# Patient Record
Sex: Female | Born: 1960 | Smoking: Former smoker
Health system: Southern US, Community
[De-identification: ages and names within clinical notes are randomized; demographics above are authoritative.]

## PROBLEM LIST (undated history)

## (undated) DIAGNOSIS — I10 Essential (primary) hypertension: Secondary | ICD-10-CM

## (undated) DIAGNOSIS — R51 Headache: Secondary | ICD-10-CM

## (undated) DIAGNOSIS — K219 Gastro-esophageal reflux disease without esophagitis: Secondary | ICD-10-CM

## (undated) DIAGNOSIS — T7840XA Allergy, unspecified, initial encounter: Secondary | ICD-10-CM

## (undated) DIAGNOSIS — Z87442 Personal history of urinary calculi: Secondary | ICD-10-CM

## (undated) HISTORY — PX: LITHOTRIPSY: SUR834

## (undated) HISTORY — PX: TEMPOROMANDIBULAR JOINT SURGERY: SHX35

## (undated) HISTORY — DX: Allergy, unspecified, initial encounter: T78.40XA

## (undated) HISTORY — PX: UPPER GASTROINTESTINAL ENDOSCOPY: SHX188

## (undated) HISTORY — PX: TUBAL LIGATION: SHX77

---

## 1999-09-26 ENCOUNTER — Other Ambulatory Visit: Admission: RE | Admit: 1999-09-26 | Discharge: 1999-09-26 | Payer: Self-pay | Admitting: Gynecology

## 2002-01-22 ENCOUNTER — Other Ambulatory Visit: Admission: RE | Admit: 2002-01-22 | Discharge: 2002-01-22 | Payer: Self-pay | Admitting: Gynecology

## 2003-01-27 ENCOUNTER — Other Ambulatory Visit: Admission: RE | Admit: 2003-01-27 | Discharge: 2003-01-27 | Payer: Self-pay | Admitting: Gynecology

## 2004-09-21 ENCOUNTER — Other Ambulatory Visit: Admission: RE | Admit: 2004-09-21 | Discharge: 2004-09-21 | Payer: Self-pay | Admitting: Gynecology

## 2011-05-07 DIAGNOSIS — Z87442 Personal history of urinary calculi: Secondary | ICD-10-CM

## 2011-05-07 HISTORY — DX: Personal history of urinary calculi: Z87.442

## 2013-03-05 ENCOUNTER — Encounter: Payer: Self-pay | Admitting: Gastroenterology

## 2013-08-05 HISTORY — PX: OTHER SURGICAL HISTORY: SHX169

## 2013-09-19 ENCOUNTER — Ambulatory Visit: Payer: Self-pay | Admitting: Orthopedic Surgery

## 2013-09-30 ENCOUNTER — Other Ambulatory Visit (HOSPITAL_COMMUNITY): Payer: Self-pay | Admitting: Anesthesiology

## 2013-09-30 NOTE — Patient Instructions (Addendum)
[place]  09/30/2013   Your procedure is scheduled on: [place]  Report to [facility] Main Entrance and follow signs to  [place] at 830 AM.  Call this number if you have problems the morning of surgery [phone]   Remember:  Do not eat food or drink liquids :After Midnight.     Take these medicines the morning of surgery with A SIP OF WATER: no meds to take                               You may not have any metal on your body including hair pins and piercings  Do not wear jewelry, make-up, lotions, powders, or deodorant.   Men may shave face and neck.  Do not bring valuables to the hospital. [place].  Contacts, dentures or bridgework may not be worn into surgery.  Leave suitcase in the car. After surgery it may be brought to your room.  For patients admitted to the hospital, checkout time is 11:00 AM the day of discharge.   ________________________________________________________________________  [facility] - Preparing for Surgery Before surgery, you can play an important role.  Because skin is not sterile, your skin needs to be as free of germs as possible.  You can reduce the number of germs on your skin by washing with CHG (chlorahexidine gluconate) soap before surgery.  CHG is an antiseptic cleaner which kills germs and bonds with the skin to continue killing germs even after washing. Please DO NOT use if you have an allergy to CHG or antibacterial soaps.  If your skin becomes reddened/irritated stop using the CHG and inform your nurse when you arrive at Short Stay. Do not shave (including legs and underarms) for at least 48 hours prior to the first CHG shower.  You may shave your face/neck. Please follow these instructions carefully:  1.  Shower with CHG Soap the night before surgery and the  morning of Surgery.  2.  If you choose to wash your hair, wash your hair first as usual with your  normal   shampoo.  3.  After you shampoo, rinse your hair and body thoroughly to remove the  shampoo.                           4.  Use CHG as you would any other liquid soap.  You can apply chg directly  to the skin and wash                       Gently with a scrungie or clean washcloth.  5.  Apply the CHG Soap to your body ONLY FROM THE NECK DOWN.   Do not use on face/ open                           Wound or open sores. Avoid contact with eyes, ears mouth and genitals (private parts).                       Wash face,  Genitals (private parts) with your normal soap.             6.  Wash thoroughly, paying special attention to the area where your surgery  will be performed.  7.  Thoroughly rinse your body with warm water from the neck down.  8.  DO NOT shower/wash with your normal soap after using and rinsing off  the CHG Soap.                9.  Pat yourself dry with a clean towel.            10.  Wear clean pajamas.            11.  Place clean sheets on your bed the night of your first shower and do not  sleep with pets. Day of Surgery : Do not apply any lotions/deodorants the morning of surgery.  Please wear clean clothes to the hospital/surgery center.  FAILURE TO FOLLOW THESE INSTRUCTIONS MAY RESULT IN THE CANCELLATION OF YOUR SURGERY PATIENT SIGNATURE_________________________________  NURSE SIGNATURE__________________________________  ________________________________________________________________________   Adam Phenix  An incentive spirometer is a tool that can help keep your lungs clear and active. This tool measures how well you are filling your lungs with each breath. Taking long deep breaths may help reverse or decrease the chance of developing breathing (pulmonary) problems (especially infection) following:  A long period of time when you are unable to move or be active. BEFORE THE PROCEDURE   If the spirometer includes an indicator to show your best effort, your nurse or  respiratory therapist will set it to a desired goal.  If possible, sit up straight or lean slightly forward. Try not to slouch.  Hold the incentive spirometer in an upright position. INSTRUCTIONS FOR USE  1. Sit on the edge of your bed if possible, or sit up as far as you can in bed or on a chair. 2. Hold the incentive spirometer in an upright position. 3. Breathe out normally. 4. Place the mouthpiece in your mouth and seal your lips tightly around it. 5. Breathe in slowly and as deeply as possible, raising the piston or the ball toward the top of the column. 6. Hold your breath for 3-5 seconds or for as long as possible. Allow the piston or ball to fall to the bottom of the column. 7. Remove the mouthpiece from your mouth and breathe out normally. 8. Rest for a few seconds and repeat Steps 1 through 7 at least 10 times every 1-2 hours when you are awake. Take your time and take a few normal breaths between deep breaths. 9. The spirometer may include an indicator to show your best effort. Use the indicator as a goal to work toward during each repetition. 10. After each set of 10 deep breaths, practice coughing to be sure your lungs are clear. If you have an incision (the cut made at the time of surgery), support your incision when coughing by placing a pillow or rolled up towels firmly against it. Once you are able to get out of bed, walk around indoors and cough well. You may stop using the incentive spirometer when instructed by your caregiver.  RISKS AND COMPLICATIONS  Take your time so you do not get dizzy or light-headed.  If you are in pain, you may need to take or ask for pain medication before doing incentive spirometry. It is harder to take a deep breath if you are having pain. AFTER USE  Rest and breathe slowly and easily.  It can be helpful to keep track of a log of your progress. Your caregiver can provide you with a simple table to help with this. If you are using the  spirometer at home, follow these instructions: [place] IF:   You are having difficultly using the spirometer.  You have trouble using the spirometer as often as instructed.  Your pain medication is not giving enough relief while using the spirometer.  You develop fever of 100.5 F (38.1 C) or higher. SEEK IMMEDIATE MEDICAL CARE IF:   You cough up bloody sputum that had not been present before.  You develop fever of 102 F (38.9 C) or greater.  You develop worsening pain at or near the incision site. MAKE SURE YOU:   Understand these instructions.  Will watch your condition.  Will get help right away if you are not doing well or get worse. Document Released: 06/04/2006 Document Revised: 04/16/2011 Document Reviewed: 08/05/2006 [facility] Patient Information 2014 [place], [place].   ________________________________________________________________________

## 2013-10-01 ENCOUNTER — Ambulatory Visit (HOSPITAL_COMMUNITY)
Admission: RE | Admit: 2013-10-01 | Discharge: 2013-10-01 | Disposition: A | Discharge status: Home / Self Care | Payer: BC Managed Care – PPO | Source: Ambulatory Visit | Attending: Anesthesiology | Admitting: Anesthesiology

## 2013-10-01 ENCOUNTER — Encounter (HOSPITAL_COMMUNITY)
Admission: RE | Admit: 2013-10-01 | Discharge: 2013-10-01 | Disposition: A | Discharge status: Home / Self Care | Payer: BC Managed Care – PPO | Source: Ambulatory Visit | Attending: Specialist | Admitting: Specialist

## 2013-10-01 ENCOUNTER — Encounter (HOSPITAL_COMMUNITY): Payer: Self-pay | Admitting: Pharmacy Technician

## 2013-10-01 ENCOUNTER — Encounter (HOSPITAL_COMMUNITY): Payer: Self-pay

## 2013-10-01 DIAGNOSIS — Z01818 Encounter for other preprocedural examination: Secondary | ICD-10-CM | POA: Insufficient documentation

## 2013-10-01 DIAGNOSIS — I10 Essential (primary) hypertension: Secondary | ICD-10-CM | POA: Insufficient documentation

## 2013-10-01 HISTORY — DX: Essential (primary) hypertension: I10

## 2013-10-01 HISTORY — DX: Headache: R51

## 2013-10-01 HISTORY — DX: Gastro-esophageal reflux disease without esophagitis: K21.9

## 2013-10-01 HISTORY — DX: Personal history of urinary calculi: Z87.442

## 2013-10-01 LAB — CBC
HEMATOCRIT: 40 % (ref 36.0–46.0)
HEMOGLOBIN: 13.3 g/dL (ref 12.0–15.0)
MCH: 29.4 pg (ref 26.0–34.0)
MCHC: 33.3 g/dL (ref 30.0–36.0)
MCV: 88.3 fL (ref 78.0–100.0)
Platelets: 222 10*3/uL (ref 150–400)
RBC: 4.53 MIL/uL (ref 3.87–5.11)
RDW: 13 % (ref 11.5–15.5)
WBC: 5.7 10*3/uL (ref 4.0–10.5)

## 2013-10-01 LAB — BASIC METABOLIC PANEL
Anion gap: 9 (ref 5–15)
BUN: 9 mg/dL (ref 6–23)
CHLORIDE: 102 meq/L (ref 96–112)
CO2: 28 meq/L (ref 19–32)
Calcium: 9.6 mg/dL (ref 8.4–10.5)
Creatinine, Ser: 0.69 mg/dL (ref 0.50–1.10)
GFR calc Af Amer: >90 mL/min (ref >90)
GFR calc non Af Amer: >90 mL/min (ref >90)
Glucose, Bld: 96 mg/dL (ref 70–99)
POTASSIUM: 3.9 meq/L (ref 3.7–5.3)
Sodium: 139 mEq/L (ref 137–147)

## 2013-10-02 ENCOUNTER — Ambulatory Visit: Payer: Self-pay | Admitting: Orthopedic Surgery

## 2013-10-02 NOTE — H&P (Signed)
Leslie Sandoval DOB: March 11, 1960 Married / Language: English / Race: White Female  Chief Complaint: Left shoulder pain  History of Present Illness  The patient is a 53 year old female who presents today for follow up of their shoulder. The patient is being followed for their left shoulder pain. They are 9 1/2 months out from when symptoms began. Symptoms reported today include: pain. The patient feels that they are doing poorly. Current treatment includes: activity modification and NSAIDs. The following medication has been used for pain control: ibuprofen 800mg . The patient has reported improvement of their symptoms with: Cortisone injections.  The patient follows up still having pain, worse with activity and better with rest.  Allergies No Known Drug Allergies11/05/2012  Family History Heart Disease Mother. Depression Sister, First Degree Relatives.  Social History Tobacco use Former smoker. 12/09/2012  Medication History Benicar (Oral) Specific dose unknown - Active. Dexilant (Oral) Specific dose unknown - Active. Aspirin (325MG  Tablet, Oral) Active. Ibuprofen (800MG  Tablet, 1 (one) Oral) Active. Medications Reconciled  Physical Exam On exam she is tender over the [facility] joint. Positive crossover, positive impingement sign. Nontender over the acromion itself. She has decreased abduction and external rotation. She has loss of forward flexion. She is neurologically intact.  RADIOGRAPHS: MRI demonstrates no tear. AC arthrosis, erosion of the distal clavicle, associated edema.  Assessment & Plan Impingement syndrome, shoulder, left Adhesive capsulitis of left shoulder  Persistently symptomatic and disabling AC arthrosis, adhesive capsulitis, and impingement syndrome.  We discussed options of living with her symptoms, injections, continued activity modifications, home exercises. She is not disabled by her symptoms, therefore, I discussed manipulation under anesthesia,  examination under anesthesia, left shoulder arthroscopy, subacromial decompression, distal clavicle resection, possible open rotator cuff, but I did indicate it is unlikely.  I had a long discussion with the patient concerning risks and benefits of shoulder arthroscopy including no change, worsening in symptoms, need for manipulation of the extremity, need for open rotator cuff repair. Also discussed infection, DVT, PE, anesthetic complications, etc. Also included was the possibility of requirement for a repeat debridement in the future. Perioperative course was discussed in detail as well as time for recovery. An illustrated handout was provided and discussed in detail.  Analgesics in the interim. Outpatient.  Plan left shoulder EUA, MUA, arthroscopy, SAD,  Mini-open RCR  Lacie Draft PA-C for Dr. Tonita Cong

## 2013-10-08 ENCOUNTER — Ambulatory Visit (HOSPITAL_COMMUNITY)
Admission: RE | Admit: 2013-10-08 | Discharge: 2013-10-09 | Disposition: A | Discharge status: Home / Self Care | Payer: BC Managed Care – PPO | Source: Ambulatory Visit | Attending: Specialist | Admitting: Specialist

## 2013-10-08 ENCOUNTER — Encounter (HOSPITAL_COMMUNITY): Payer: BC Managed Care – PPO | Admitting: Anesthesiology

## 2013-10-08 ENCOUNTER — Encounter (HOSPITAL_COMMUNITY)
Admission: RE | Disposition: A | Discharge status: Home / Self Care | Payer: Self-pay | Source: Ambulatory Visit | Attending: Specialist

## 2013-10-08 ENCOUNTER — Ambulatory Visit (HOSPITAL_COMMUNITY): Payer: BC Managed Care – PPO | Admitting: Anesthesiology

## 2013-10-08 ENCOUNTER — Encounter (HOSPITAL_COMMUNITY): Payer: Self-pay | Admitting: *Deleted

## 2013-10-08 DIAGNOSIS — M6580 Other synovitis and tenosynovitis, unspecified site: Secondary | ICD-10-CM | POA: Diagnosis not present

## 2013-10-08 DIAGNOSIS — M7540 Impingement syndrome of unspecified shoulder: Secondary | ICD-10-CM | POA: Diagnosis present

## 2013-10-08 DIAGNOSIS — M7542 Impingement syndrome of left shoulder: Secondary | ICD-10-CM

## 2013-10-08 DIAGNOSIS — Z7982 Long term (current) use of aspirin: Secondary | ICD-10-CM | POA: Insufficient documentation

## 2013-10-08 DIAGNOSIS — S43439A Superior glenoid labrum lesion of unspecified shoulder, initial encounter: Secondary | ICD-10-CM | POA: Insufficient documentation

## 2013-10-08 DIAGNOSIS — Z79899 Other long term (current) drug therapy: Secondary | ICD-10-CM | POA: Insufficient documentation

## 2013-10-08 DIAGNOSIS — M25819 Other specified joint disorders, unspecified shoulder: Secondary | ICD-10-CM | POA: Diagnosis not present

## 2013-10-08 DIAGNOSIS — M19019 Primary osteoarthritis, unspecified shoulder: Secondary | ICD-10-CM | POA: Diagnosis not present

## 2013-10-08 DIAGNOSIS — Z87891 Personal history of nicotine dependence: Secondary | ICD-10-CM | POA: Insufficient documentation

## 2013-10-08 DIAGNOSIS — X58XXXA Exposure to other specified factors, initial encounter: Secondary | ICD-10-CM | POA: Diagnosis not present

## 2013-10-08 DIAGNOSIS — M7500 Adhesive capsulitis of unspecified shoulder: Secondary | ICD-10-CM | POA: Diagnosis not present

## 2013-10-08 DIAGNOSIS — M7580 Other shoulder lesions, unspecified shoulder: Secondary | ICD-10-CM

## 2013-10-08 HISTORY — PX: SHOULDER ARTHROSCOPY WITH SUBACROMIAL DECOMPRESSION AND OPEN ROTATOR C: SHX5688

## 2013-10-08 SURGERY — SHOULDER ARTHROSCOPY WITH SUBACROMIAL DECOMPRESSION AND OPEN ROTATOR CUFF REPAIR, OPEN BICEPS TENDON REPAIR
Anesthesia: General | Site: Shoulder | Laterality: Left

## 2013-10-08 MED ORDER — PROPOFOL 10 MG/ML IV BOLUS
INTRAVENOUS | Status: DC | PRN
  Administered 2013-10-08: 110 mg via INTRAVENOUS

## 2013-10-08 MED ORDER — HYDROMORPHONE HCL PF 1 MG/ML IJ SOLN
INTRAMUSCULAR | Status: AC
  Filled 2013-10-08: qty 1

## 2013-10-08 MED ORDER — FENTANYL CITRATE 0.05 MG/ML IJ SOLN
INTRAMUSCULAR | Status: DC | PRN
  Administered 2013-10-08 (×2): 100 ug via INTRAVENOUS
  Administered 2013-10-08: 50 ug via INTRAVENOUS

## 2013-10-08 MED ORDER — PANTOPRAZOLE SODIUM 40 MG PO TBEC
40.0000 mg | DELAYED_RELEASE_TABLET | Freq: Every day | ORAL | Status: DC
  Administered 2013-10-08: 40 mg via ORAL
  Filled 2013-10-08 (×2): qty 1

## 2013-10-08 MED ORDER — OXYCODONE-ACETAMINOPHEN 5-325 MG PO TABS
1.0000 | ORAL_TABLET | ORAL | Status: DC | PRN
  Administered 2013-10-08 – 2013-10-09 (×3): 2 via ORAL
  Filled 2013-10-08 (×3): qty 2

## 2013-10-08 MED ORDER — PROPOFOL 10 MG/ML IV BOLUS
INTRAVENOUS | Status: AC
  Filled 2013-10-08: qty 20

## 2013-10-08 MED ORDER — NEOSTIGMINE METHYLSULFATE 10 MG/10ML IV SOLN
INTRAVENOUS | Status: AC
  Filled 2013-10-08: qty 1

## 2013-10-08 MED ORDER — KETOROLAC TROMETHAMINE 15 MG/ML IJ SOLN: 15.0000 mg | Freq: Four times a day (QID) | INTRAMUSCULAR | Status: DC

## 2013-10-08 MED ORDER — METOCLOPRAMIDE HCL 5 MG/ML IJ SOLN: 5.0000 mg | Freq: Three times a day (TID) | INTRAMUSCULAR | Status: DC | PRN

## 2013-10-08 MED ORDER — MENTHOL 3 MG MT LOZG: 1.0000 | LOZENGE | OROMUCOSAL | Status: DC | PRN

## 2013-10-08 MED ORDER — ENOXAPARIN SODIUM 40 MG/0.4ML ~~LOC~~ SOLN
40.0000 mg | SUBCUTANEOUS | Status: DC
  Filled 2013-10-08 (×2): qty 0.4

## 2013-10-08 MED ORDER — OXYCODONE-ACETAMINOPHEN 7.5-325 MG PO TABS: 1.0000 | ORAL_TABLET | ORAL | Status: DC | PRN

## 2013-10-08 MED ORDER — MIDAZOLAM HCL 2 MG/2ML IJ SOLN
INTRAMUSCULAR | Status: AC
  Filled 2013-10-08: qty 2

## 2013-10-08 MED ORDER — PROMETHAZINE HCL 25 MG/ML IJ SOLN: 6.2500 mg | INTRAMUSCULAR | Status: DC | PRN

## 2013-10-08 MED ORDER — ONDANSETRON HCL 4 MG PO TABS: 4.0000 mg | ORAL_TABLET | Freq: Four times a day (QID) | ORAL | Status: DC | PRN

## 2013-10-08 MED ORDER — MIDAZOLAM HCL 5 MG/5ML IJ SOLN
INTRAMUSCULAR | Status: DC | PRN
  Administered 2013-10-08: 2 mg via INTRAVENOUS

## 2013-10-08 MED ORDER — GLYCOPYRROLATE 0.2 MG/ML IJ SOLN
INTRAMUSCULAR | Status: DC | PRN
  Administered 2013-10-08: 0.6 mg via INTRAVENOUS

## 2013-10-08 MED ORDER — ROCURONIUM BROMIDE 100 MG/10ML IV SOLN
INTRAVENOUS | Status: AC
  Filled 2013-10-08: qty 1

## 2013-10-08 MED ORDER — DEXAMETHASONE SODIUM PHOSPHATE 10 MG/ML IJ SOLN
INTRAMUSCULAR | Status: AC
  Filled 2013-10-08: qty 1

## 2013-10-08 MED ORDER — KCL IN DEXTROSE-NACL 20-5-0.45 MEQ/L-%-% IV SOLN
INTRAVENOUS | Status: DC
  Administered 2013-10-08: 14:00:00 via INTRAVENOUS
  Filled 2013-10-08 (×2): qty 1000

## 2013-10-08 MED ORDER — GLYCOPYRROLATE 0.2 MG/ML IJ SOLN
INTRAMUSCULAR | Status: AC
  Filled 2013-10-08: qty 3

## 2013-10-08 MED ORDER — EPINEPHRINE HCL 1 MG/ML IJ SOLN
INTRAMUSCULAR | Status: AC
  Filled 2013-10-08: qty 1

## 2013-10-08 MED ORDER — BISACODYL 5 MG PO TBEC: 5.0000 mg | DELAYED_RELEASE_TABLET | Freq: Every day | ORAL | Status: DC | PRN

## 2013-10-08 MED ORDER — KCL IN DEXTROSE-NACL 20-5-0.45 MEQ/L-%-% IV SOLN
INTRAVENOUS | Status: AC
  Filled 2013-10-08: qty 1000

## 2013-10-08 MED ORDER — ROCURONIUM BROMIDE 100 MG/10ML IV SOLN
INTRAVENOUS | Status: DC | PRN
  Administered 2013-10-08: 40 mg via INTRAVENOUS

## 2013-10-08 MED ORDER — METHOCARBAMOL 500 MG PO TABS
500.0000 mg | ORAL_TABLET | Freq: Four times a day (QID) | ORAL | Status: DC | PRN
  Administered 2013-10-08 – 2013-10-09 (×2): 500 mg via ORAL
  Filled 2013-10-08 (×2): qty 1

## 2013-10-08 MED ORDER — CEFAZOLIN SODIUM 1-5 GM-% IV SOLN
1.0000 g | Freq: Four times a day (QID) | INTRAVENOUS | Status: AC
  Administered 2013-10-08 – 2013-10-09 (×3): 1 g via INTRAVENOUS
  Filled 2013-10-08 (×3): qty 50

## 2013-10-08 MED ORDER — CEFAZOLIN SODIUM-DEXTROSE 2-3 GM-% IV SOLR
2.0000 g | INTRAVENOUS | Status: AC
  Administered 2013-10-08: 2 g via INTRAVENOUS

## 2013-10-08 MED ORDER — BUPIVACAINE-EPINEPHRINE 0.5% -1:200000 IJ SOLN
INTRAMUSCULAR | Status: DC | PRN
  Administered 2013-10-08: 20 mL

## 2013-10-08 MED ORDER — LIDOCAINE HCL (PF) 2 % IJ SOLN
INTRAMUSCULAR | Status: DC | PRN
  Administered 2013-10-08: 75 mg via INTRADERMAL

## 2013-10-08 MED ORDER — SODIUM CHLORIDE 0.9 % IR SOLN
Status: DC | PRN
  Administered 2013-10-08: 12:00:00

## 2013-10-08 MED ORDER — SENNOSIDES-DOCUSATE SODIUM 8.6-50 MG PO TABS: 1.0000 | ORAL_TABLET | Freq: Every evening | ORAL | Status: DC | PRN

## 2013-10-08 MED ORDER — ASPIRIN EC 81 MG PO TBEC
81.0000 mg | DELAYED_RELEASE_TABLET | Freq: Every day | ORAL | Status: DC
  Filled 2013-10-08: qty 1

## 2013-10-08 MED ORDER — DEXAMETHASONE SODIUM PHOSPHATE 10 MG/ML IJ SOLN
INTRAMUSCULAR | Status: DC | PRN
  Administered 2013-10-08: 10 mg via INTRAVENOUS

## 2013-10-08 MED ORDER — ONDANSETRON HCL 4 MG/2ML IJ SOLN
INTRAMUSCULAR | Status: AC
  Filled 2013-10-08: qty 2

## 2013-10-08 MED ORDER — ONDANSETRON HCL 4 MG/2ML IJ SOLN: 4.0000 mg | Freq: Four times a day (QID) | INTRAMUSCULAR | Status: DC | PRN

## 2013-10-08 MED ORDER — METHOCARBAMOL 1000 MG/10ML IJ SOLN
500.0000 mg | Freq: Four times a day (QID) | INTRAVENOUS | Status: DC | PRN
  Administered 2013-10-08: 500 mg via INTRAVENOUS
  Filled 2013-10-08: qty 5

## 2013-10-08 MED ORDER — OXYCODONE HCL 5 MG PO TABS: 5.0000 mg | ORAL_TABLET | Freq: Once | ORAL | Status: DC | PRN

## 2013-10-08 MED ORDER — MEPERIDINE HCL 50 MG/ML IJ SOLN: 6.2500 mg | INTRAMUSCULAR | Status: DC | PRN

## 2013-10-08 MED ORDER — LACTATED RINGERS IR SOLN
Status: DC | PRN
  Administered 2013-10-08: 3000 mL

## 2013-10-08 MED ORDER — LACTATED RINGERS IV SOLN
INTRAVENOUS | Status: DC
  Administered 2013-10-08: 1000 mL via INTRAVENOUS

## 2013-10-08 MED ORDER — DOCUSATE SODIUM 100 MG PO CAPS
100.0000 mg | ORAL_CAPSULE | Freq: Two times a day (BID) | ORAL | Status: DC
  Administered 2013-10-08: 100 mg via ORAL

## 2013-10-08 MED ORDER — METHOCARBAMOL 500 MG PO TABS: 500.0000 mg | ORAL_TABLET | Freq: Three times a day (TID) | ORAL | Status: AC | PRN

## 2013-10-08 MED ORDER — FENTANYL CITRATE 0.05 MG/ML IJ SOLN
INTRAMUSCULAR | Status: AC
  Filled 2013-10-08: qty 2

## 2013-10-08 MED ORDER — HYDROCODONE-ACETAMINOPHEN 5-325 MG PO TABS: 1.0000 | ORAL_TABLET | ORAL | Status: DC | PRN

## 2013-10-08 MED ORDER — CEFAZOLIN SODIUM-DEXTROSE 2-3 GM-% IV SOLR
INTRAVENOUS | Status: AC
  Filled 2013-10-08: qty 50

## 2013-10-08 MED ORDER — NEOSTIGMINE METHYLSULFATE 10 MG/10ML IV SOLN
INTRAVENOUS | Status: DC | PRN
  Administered 2013-10-08: 4 mg via INTRAVENOUS

## 2013-10-08 MED ORDER — LIDOCAINE HCL (CARDIAC) 20 MG/ML IV SOLN
INTRAVENOUS | Status: AC
  Filled 2013-10-08: qty 5

## 2013-10-08 MED ORDER — ACETAMINOPHEN 650 MG RE SUPP: 650.0000 mg | Freq: Four times a day (QID) | RECTAL | Status: DC | PRN

## 2013-10-08 MED ORDER — HYDROMORPHONE HCL PF 1 MG/ML IJ SOLN
0.5000 mg | INTRAMUSCULAR | Status: DC | PRN
  Administered 2013-10-08: 1 mg via INTRAVENOUS
  Filled 2013-10-08: qty 1

## 2013-10-08 MED ORDER — PHENYLEPHRINE 40 MCG/ML (10ML) SYRINGE FOR IV PUSH (FOR BLOOD PRESSURE SUPPORT)
PREFILLED_SYRINGE | INTRAVENOUS | Status: AC
  Filled 2013-10-08: qty 10

## 2013-10-08 MED ORDER — HYDROMORPHONE HCL PF 1 MG/ML IJ SOLN
0.2500 mg | INTRAMUSCULAR | Status: DC | PRN
  Administered 2013-10-08 (×3): 0.5 mg via INTRAVENOUS

## 2013-10-08 MED ORDER — BUPIVACAINE-EPINEPHRINE (PF) 0.5% -1:200000 IJ SOLN
INTRAMUSCULAR | Status: AC
  Filled 2013-10-08: qty 30

## 2013-10-08 MED ORDER — FENTANYL CITRATE 0.05 MG/ML IJ SOLN
INTRAMUSCULAR | Status: AC
  Filled 2013-10-08: qty 5

## 2013-10-08 MED ORDER — METOCLOPRAMIDE HCL 10 MG PO TABS
5.0000 mg | ORAL_TABLET | Freq: Three times a day (TID) | ORAL | Status: DC | PRN
Start: 2013-10-08 — End: 2013-10-09

## 2013-10-08 MED ORDER — EPINEPHRINE HCL 1 MG/ML IJ SOLN
INTRAMUSCULAR | Status: DC | PRN
  Administered 2013-10-08: 1 mL

## 2013-10-08 MED ORDER — DIPHENHYDRAMINE HCL 12.5 MG/5ML PO ELIX: 12.5000 mg | ORAL_SOLUTION | ORAL | Status: DC | PRN

## 2013-10-08 MED ORDER — ACETAMINOPHEN 325 MG PO TABS
650.0000 mg | ORAL_TABLET | Freq: Four times a day (QID) | ORAL | Status: DC | PRN
Start: 2013-10-08 — End: 2013-10-09

## 2013-10-08 MED ORDER — PHENYLEPHRINE HCL 10 MG/ML IJ SOLN
INTRAMUSCULAR | Status: DC | PRN
  Administered 2013-10-08 (×2): 80 ug via INTRAVENOUS

## 2013-10-08 MED ORDER — OXYCODONE HCL 5 MG/5ML PO SOLN: 5.0000 mg | Freq: Once | ORAL | Status: DC | PRN

## 2013-10-08 MED ORDER — PHENOL 1.4 % MT LIQD: 1.0000 | OROMUCOSAL | Status: DC | PRN

## 2013-10-08 MED ORDER — SODIUM CHLORIDE 0.9 % IR SOLN
Status: AC
  Filled 2013-10-08: qty 1

## 2013-10-08 MED ORDER — DOCUSATE SODIUM 100 MG PO CAPS: 100.0000 mg | ORAL_CAPSULE | Freq: Two times a day (BID) | ORAL | Status: AC | PRN

## 2013-10-08 MED ORDER — ONDANSETRON HCL 4 MG/2ML IJ SOLN
INTRAMUSCULAR | Status: DC | PRN
  Administered 2013-10-08: 4 mg via INTRAVENOUS

## 2013-10-08 MED ORDER — FENTANYL CITRATE 0.05 MG/ML IJ SOLN
25.0000 ug | INTRAMUSCULAR | Status: DC | PRN
  Administered 2013-10-08 (×3): 50 ug via INTRAVENOUS

## 2013-10-08 SURGICAL SUPPLY — 39 items
BLADE CUDA SHAVER 3.5 (BLADE) ×3 IMPLANT
BLADE SURG SZ11 CARB STEEL (BLADE) ×3 IMPLANT
CANNULA ACUFO 5X76 (CANNULA) ×3 IMPLANT
CLOSURE WOUND 1/2 X4 (GAUZE/BANDAGES/DRESSINGS) ×1
CLOTH 2% CHLOROHEXIDINE 3PK (PERSONAL CARE ITEMS) ×3 IMPLANT
DRAPE ORTHO SPLIT 77X108 STRL (DRAPES)
DRAPE POUCH INSTRU U-SHP 10X18 (DRAPES) ×3 IMPLANT
DRAPE STERI 35X30 U-POUCH (DRAPES) ×3 IMPLANT
DRAPE SURG ORHT 6 SPLT 77X108 (DRAPES) IMPLANT
DRSG AQUACEL AG ADV 3.5X 4 (GAUZE/BANDAGES/DRESSINGS) ×2 IMPLANT
DRSG AQUACEL AG ADV 3.5X 6 (GAUZE/BANDAGES/DRESSINGS) ×2 IMPLANT
DURAPREP 26ML APPLICATOR (WOUND CARE) ×3 IMPLANT
ELECT NDL TIP 2.8 STRL (NEEDLE) ×1 IMPLANT
ELECT NEEDLE TIP 2.8 STRL (NEEDLE) ×3 IMPLANT
GLOVE BIOGEL PI IND STRL 7.5 (GLOVE) ×1 IMPLANT
GLOVE BIOGEL PI INDICATOR 7.5 (GLOVE) ×2
GLOVE SURG SS PI 7.5 STRL IVOR (GLOVE) ×3 IMPLANT
GLOVE SURG SS PI 8.0 STRL IVOR (GLOVE) ×6 IMPLANT
GOWN STRL REUS W/TWL XL LVL3 (GOWN DISPOSABLE) ×8 IMPLANT
KIT BASIN OR (CUSTOM PROCEDURE TRAY) ×3 IMPLANT
KIT POSITION SHOULDER SCHLEI (MISCELLANEOUS) ×3 IMPLANT
MANIFOLD NEPTUNE II (INSTRUMENTS) ×3 IMPLANT
NDL SPNL 18GX3.5 QUINCKE PK (NEEDLE) ×1 IMPLANT
NEEDLE SPNL 18GX3.5 QUINCKE PK (NEEDLE) ×3 IMPLANT
PACK SHOULDER CUSTOM OPM052 (CUSTOM PROCEDURE TRAY) ×3 IMPLANT
POSITIONER SURGICAL ARM (MISCELLANEOUS) ×3 IMPLANT
SET ARTHROSCOPY TUBING (MISCELLANEOUS) ×3
SET ARTHROSCOPY TUBING LN (MISCELLANEOUS) ×1 IMPLANT
SLING ARM MED ADULT FOAM STRAP (SOFTGOODS) ×2 IMPLANT
STRIP CLOSURE SKIN 1/2X4 (GAUZE/BANDAGES/DRESSINGS) ×1 IMPLANT
SUCTION FRAZIER TIP 10 FR DISP (SUCTIONS) ×3 IMPLANT
SUT ETHILON 4 0 PS 2 18 (SUTURE) ×3 IMPLANT
SUT PROLENE 3 0 PS 2 (SUTURE) ×2 IMPLANT
SUT VIC AB 1-0 CT2 27 (SUTURE) ×2 IMPLANT
SUT VIC AB 2-0 CT2 27 (SUTURE) ×2 IMPLANT
TOWEL OR 17X26 10 PK STRL BLUE (TOWEL DISPOSABLE) ×3 IMPLANT
TOWEL OR NON WOVEN STRL DISP B (DISPOSABLE) ×2 IMPLANT
TUBING CONNECTING 10 (TUBING) ×2 IMPLANT
TUBING CONNECTING 10' (TUBING) ×1

## 2013-10-08 NOTE — Interval H&P Note (Signed)
History and Physical Interval Note:  10/08/2013 6:30 AM  Leslie Sandoval  has presented today for surgery, with the diagnosis of LEFT SHOULDER IMPINGEMENT SYNDOME ADHESIVE CAPSULITIS  AC ARTHROSIS   The various methods of treatment have been discussed with the patient and family. After consideration of risks, benefits and other options for treatment, the patient has consented to  Procedure(s): LEFT SHOULDER ARTHROSCOPY MANIPULATION UNDER ANESTHESIA SUBACROMIAL DECOMPRESSION AND MINI OPEN DISTAL CLAVICLE RESECTION  (Left) as a surgical intervention .  The patient's history has been reviewed, patient examined, no change in status, stable for surgery.  I have reviewed the patient's chart and labs.  Questions were answered to the patient's satisfaction.     Rykar Lebleu C

## 2013-10-08 NOTE — Discharge Instructions (Signed)
SHOULDER ARTHROSCOPY POSTOPERATIVE INSTRUCTIONS FOR DR. Tonita Cong  1.  Ice pack on shoulder 3-4 times per day.  2.  Aquacel dressing may remain in place until follow up. May shower with aquacel in place.  3,  May get out of sling in AM and start gentle pendulum exercises.  You are encouraged       to move the elbow, wrist and hand.  4.  Elevate operative shoulder and elbow on pillow.  5.  Exercise your fingers to help reduce swelling.  6.  Report to your doctor should any of the following situations occur:   -Swelling of your fingers.  -Inability to wiggle your fingers.  -Coldness, turning pale or blueness of your fingers.  -Loss of sensation, numbness or tingling of your fingers.  -Unusual small or odor from under dressing.  -Excessive bleeding or drainage from the surgical site(s).  -Severe pain which is not relieved by the pain medication your doctor prescribed                for you.  7.  Call the office to schedule and appointment for 10-14 days post-op. 8.  Take one aspirin per day 325mg  with a meal if not on another blood thinner or allergic.  Patient Signature:  ________________________________________________________  Nurse's Signature:  ________________________________________________________

## 2013-10-08 NOTE — H&P (View-Only) (Signed)
Leslie Sandoval DOB: [birth year] Married / Language: English / Race: White Female  Chief Complaint: Left shoulder pain  History of Present Illness  The patient is a 53 year old female who presents today for follow up of their shoulder. The patient is being followed for their left shoulder pain. They are 9 1/2 months out from when symptoms began. Symptoms reported today include: pain. The patient feels that they are doing poorly. Current treatment includes: activity modification and NSAIDs. The following medication has been used for pain control: ibuprofen 800mg . The patient has reported improvement of their symptoms with: Cortisone injections.  The patient follows up still having pain, worse with activity and better with rest.  Allergies No Known Drug Allergies11/05/2012  Family History Heart Disease Mother. Depression Sister, First Degree Relatives.  Social History Tobacco use Former smoker. 12/09/2012  Medication History Benicar (Oral) Specific dose unknown - Active. Dexilant (Oral) Specific dose unknown - Active. Aspirin (325MG  Tablet, Oral) Active. Ibuprofen (800MG  Tablet, 1 (one) Oral) Active. Medications Reconciled  Physical Exam On exam she is tender over the [facility] joint. Positive crossover, positive impingement sign. Nontender over the acromion itself. She has decreased abduction and external rotation. She has loss of forward flexion. She is neurologically intact.  RADIOGRAPHS: MRI demonstrates no tear. AC arthrosis, erosion of the distal clavicle, associated edema.  Assessment & Plan Impingement syndrome, shoulder, left Adhesive capsulitis of left shoulder  Persistently symptomatic and disabling AC arthrosis, adhesive capsulitis, and impingement syndrome.  We discussed options of living with her symptoms, injections, continued activity modifications, home exercises. She is not disabled by her symptoms, therefore, I discussed manipulation under anesthesia,  examination under anesthesia, left shoulder arthroscopy, subacromial decompression, distal clavicle resection, possible open rotator cuff, but I did indicate it is unlikely.  I had a long discussion with the patient concerning risks and benefits of shoulder arthroscopy including no change, worsening in symptoms, need for manipulation of the extremity, need for open rotator cuff repair. Also discussed infection, DVT, PE, anesthetic complications, etc. Also included was the possibility of requirement for a repeat debridement in the future. Perioperative course was discussed in detail as well as time for recovery. An illustrated handout was provided and discussed in detail.  Analgesics in the interim. Outpatient.  Plan left shoulder EUA, MUA, arthroscopy, SAD,  Mini-open RCR  Lacie Draft PA-C for Dr. Tonita Cong

## 2013-10-08 NOTE — Brief Op Note (Signed)
10/08/2013  12:06 PM  PATIENT:  Leslie Sandoval  53 y.o. female  PRE-OPERATIVE DIAGNOSIS:  LEFT SHOULDER IMPINGEMENT SYNDOME ADHESIVE CAPSULITIS  AC ARTHROSIS   POST-OPERATIVE DIAGNOSIS:  LEFT SHOULDER IMPINGEMENT SYNDOME ADHESIVE CAPSULITIS  AC ARTHROSIS   PROCEDURE:  Procedure(s): LEFT SHOULDER ARTHROSCOPY, MANIPULATION UNDER ANESTHESIA , DEBRIDEMENT LABRAL TEAR, SUBACROMIAL DECOMPRESSION  AND MINI OPEN DISTAL CLAVICLE RESECTION  (Left)  SURGEON:  Surgeon(s) and Role:    * Johnn Hai, MD - Primary  PHYSICIAN ASSISTANT:   ASSISTANTS: [place]   ANESTHESIA:   general  EBL:     BLOOD ADMINISTERED:none  DRAINS: no   LOCAL MEDICATIONS USED:  MARCAINE     SPECIMEN:  No Specimen  DISPOSITION OF SPECIMEN:  N/A  COUNTS:  YES  TOURNIQUET:  * No tourniquets in log *  DICTATION: .Other Dictation: Dictation Number [id number]  PLAN OF CARE: Admit for overnight observation  PATIENT DISPOSITION:  PACU - hemodynamically stable.   Delay start of Pharmacological VTE agent (>24hrs) due to surgical blood loss or risk of bleeding: no

## 2013-10-08 NOTE — Transfer of Care (Signed)
Immediate Anesthesia Transfer of Care Note  Patient: Leslie Sandoval  Procedure(s) Performed: Procedure(s): LEFT SHOULDER ARTHROSCOPY, MANIPULATION UNDER ANESTHESIA , DEBRIDEMENT LABRAL TEAR, SUBACROMIAL DECOMPRESSION  AND MINI OPEN DISTAL CLAVICLE RESECTION  (Left)  Patient Location: PACU  Anesthesia Type:General  Level of Consciousness: awake, sedated and responds to stimulation  Airway & Oxygen Therapy: Patient Spontanous Breathing and Patient connected to face mask oxygen  Post-op Assessment: Report given to PACU RN and Post -op Vital signs reviewed and stable  Post vital signs: Reviewed and stable  Complications: No apparent anesthesia complications

## 2013-10-08 NOTE — Anesthesia Postprocedure Evaluation (Signed)
   Anesthesia Post-op Note  Patient: Leslie Sandoval  Procedure(s) Performed: Procedure(s) (LRB): LEFT SHOULDER ARTHROSCOPY, MANIPULATION UNDER ANESTHESIA , DEBRIDEMENT LABRAL TEAR, SUBACROMIAL DECOMPRESSION  AND MINI OPEN DISTAL CLAVICLE RESECTION  (Left)  Patient Location: PACU  Anesthesia Type: General  Level of Consciousness: awake and alert   Airway and Oxygen Therapy: Patient Spontanous Breathing  Post-op Pain: mild  Post-op Assessment: Post-op Vital signs reviewed, Patient's Cardiovascular Status Stable, Respiratory Function Stable, Patent Airway and No signs of Nausea or vomiting  Last Vitals:  Filed Vitals:   10/08/13 1302  BP: 152/67  Pulse: 63  Temp:   Resp: 14    Post-op Vital Signs: stable   Complications: No apparent anesthesia complications

## 2013-10-08 NOTE — Anesthesia Preprocedure Evaluation (Addendum)
Anesthesia Evaluation    Airway Mallampati: II TM Distance: >3 FB   Mouth opening: Limited Mouth Opening  Dental no notable dental hx.    Pulmonary former smoker,  breath sounds clear to auscultation  Pulmonary exam normal       Cardiovascular hypertension, Rhythm:Regular Rate:Normal     Neuro/Psych    GI/Hepatic   Endo/Other    Renal/GU      Musculoskeletal   Abdominal Normal abdominal exam  (+)   Peds  Hematology   Anesthesia Other Findings   Reproductive/Obstetrics                          Anesthesia Physical Anesthesia Plan  ASA: II  Anesthesia Plan: General   Post-op Pain Management:    Induction:   Airway Management Planned: Oral ETT  Additional Equipment:   Intra-op Plan:   Post-operative Plan:   Informed Consent: I have reviewed the patients History and Physical, chart, labs and discussed the procedure including the risks, benefits and alternatives for the proposed anesthesia with the patient or authorized representative who has indicated his/her understanding and acceptance.   Dental advisory given  Plan Discussed with:   Anesthesia Plan Comments:         Anesthesia Quick Evaluation

## 2013-10-08 NOTE — Op Note (Signed)
NAMEMarland Kitchen  SHAY, JHAVERI NO.:  [id number]  MEDICAL RECORD NO.:  [id number]  LOCATION:  4034                         FACILITY:  [facility]  PHYSICIAN:  Susa Day, M.D.    DATE OF BIRTH:  22-Mar-1960  DATE OF PROCEDURE: DATE OF DISCHARGE:                              OPERATIVE REPORT   PREOPERATIVE DIAGNOSIS:  Adhesive capsulitis, impingement syndrome, acromioclavicular arthrosis, left shoulder.  POSTOPERATIVE DIAGNOSIS:  Adhesive capsulitis, impingement syndrome, acromioclavicular arthrosis, left shoulder, anterior labral tear.  PROCEDURE PERFORMED: 1. Examination under anesthesia followed by manipulation under     anesthesia. 2. Left shoulder arthroscopy with debridement of the tear of the     anterior labrum. 3. Subacromial decompression with bursectomy. 4. Mini open distal clavicle resection.  ANESTHESIA:  General.  ASSISTANT:  Cleophas Dunker, PA.  HISTORY:  A 53 year old, shoulder pain, adhesive capsulitis, osteolysis, distal clavicle fracture, rest, activity, modification, corticosteroid injection.  The patient developed suboptimal range of motion, was indicated for manipulation followed by debridement, impingement syndrome, and distal clavicle resection.  Risks and benefits were discussed including bleeding, infection, damage to the neurovascular structures, no change in symptoms, worsening symptoms, DVT, PE, anesthetic complications, etc.  TECHNIQUE:  The patient supine, in beach-chair position, after induction of adequate general anesthesia, 2 g Kefzol, manipulated the shoulder under anesthesia.  She actually had full internal external rotation.  I increased her slight abduction, she had slight contraction and abduction, forward flexion, she had full following the manipulation about 20 degrees in each.  We stabilized the scapulothoracic region and manipulated the shoulder gently in that fashion.  Left shoulder was then prepped and draped in  usual sterile fashion.  Surgical marker was utilized to delineate the acromion, AC joint, and coracoid.  Standard posterior lateral and anterolateral portals were utilized with incision through the skin only.  The arm in the 70:30 position, gentle traction applied by the assistant, advanced the arthroscopic camera into the glenohumeral joint penetrating atraumatically.  Small incision was made halfway between the coracoid and the anterior lateral aspect of the acromion through the skin only and this was localized with an 18-gauge needle just beneath the biceps tendon.  I introduced a cannula with a labral tear that we debrided with 3.5 Cuda shaver to a stable base. There was no tear in the rotator cuff.  Glenoid and humerus was unremarkable.  Subscap was unremarkable.  Biceps was in its groove and stable.  After lavaging the joint, I copiously redirected the camera in the subacromial space and used an anterolateral portal triangulating the subacromial space, we performed a full bursectomy.  Hypertrophic exuberant bursa was noted.  I performed a full bursectomy, anteriorly, posteriorly, and laterally.  Rotator cuff was injected, but not torn. There was exuberant bursa again, this was a full bursectomy that had been performed, we were unable to deliver the clavicle in the subacromial space, so converted to a mini distal clavicle resection. Removed all our instrumentation.  Portals were closed with 4-0 nylon simple sutures and made a small incision over the distal clavicle to the skin above the deltotrapezial fascia in the capsule, skeletonized the distal clavicle with an AO elevator protecting  the soft tissues anteriorly, posteriorly, and the rotator cuff with the Baby Homans, used an oscillating saw to remove 1 cm of the distal clavicle, undercut the undersurface of the clavicle with 3 mm Kerr[place].  We copiously irrigated the wound and there was significant synovitis within the joint and  some bone-on-bone necrosis posteriorly.  This was pathologic, this was excised, irrigated.  No active bleeding.  Cuff was intact, closed the capsule with 1 Vicryl, the deltotrapezial fascia with 2-0, and the skin with Prolene.  Sterile dressings were applied.  A 0.25% Marcaine with epinephrine was infiltrated in subacromial space sling, extubated, and transported to the recovery room in satisfactory condition.  The patient tolerated the procedure well.  No complications.  Assistant, Cleophas Dunker, [place], again was used for traction of the upper extremity, monitoring the inflow and the outflow closure.     Susa Day, M.D.     Geralynn Rile  D:  10/08/2013  T:  10/08/2013  Job:  [id number]

## 2013-10-09 ENCOUNTER — Encounter (HOSPITAL_COMMUNITY): Payer: Self-pay | Admitting: Specialist

## 2013-10-09 DIAGNOSIS — M7500 Adhesive capsulitis of unspecified shoulder: Secondary | ICD-10-CM | POA: Diagnosis not present

## 2013-10-09 NOTE — Progress Notes (Signed)
Patient given discharge instructions, and verbalized an understanding of all discharge instructions.  Patient agrees with discharge plan, and is being discharged in stable medical condition.  Patient given transportation via wheelchair.  Laiylah Roettger RN 

## 2013-10-09 NOTE — Progress Notes (Signed)
Subjective: 1 Day Post-Op Procedure(s) (LRB): LEFT SHOULDER ARTHROSCOPY, MANIPULATION UNDER ANESTHESIA , DEBRIDEMENT LABRAL TEAR, SUBACROMIAL DECOMPRESSION  AND MINI OPEN DISTAL CLAVICLE RESECTION  (Left) Patient reports pain as mild.    Objective: Vital signs in last 24 hours: Temp:  [97.8 F (36.6 C)-98.7 F (37.1 C)] 98.4 F (36.9 C) (09/04 0546) Pulse Rate:  [55-93] 55 (09/04 0546) Resp:  [12-18] 18 (09/04 0546) BP: (116-162)/(55-95) 123/95 mmHg (09/04 0546) SpO2:  [95 %-100 %] 96 % (09/04 0546) Weight:  [74.844 kg (165 lb)] 74.844 kg (165 lb) (09/03 1432)  Intake/Output from previous day: 09/03 0701 - 09/04 0700 In: 4287 [P.O.:1740; I.V.:1730; IV Piggyback:105] Out: 2200 [Urine:2200] Intake/Output this shift:    No results found for this basename: HGB,  in the last 72 hours No results found for this basename: WBC, RBC, HCT, PLT,  in the last 72 hours No results found for this basename: NA, K, CL, CO2, BUN, CREATININE, GLUCOSE, CALCIUM,  in the last 72 hours No results found for this basename: LABPT, INR,  in the last 72 hours  Neurologically intact ABD soft Neurovascular intact Sensation intact distally Intact pulses distally Dorsiflexion/Plantar flexion intact Incision: dressing C/D/I and no drainage No cellulitis present Compartment soft no calf pain or sign of DVT  Assessment/Plan: 1 Day Post-Op Procedure(s) (LRB): LEFT SHOULDER ARTHROSCOPY, MANIPULATION UNDER ANESTHESIA , DEBRIDEMENT LABRAL TEAR, SUBACROMIAL DECOMPRESSION  AND MINI OPEN DISTAL CLAVICLE RESECTION  (Left) Advance diet Up with therapy D/C IV fluids PT today Discussed D/C instructions, restrictions, use of sling Follow up 10-14 days with Dr. Tonita Cong for suture removal  Leslie Draft M. 10/09/2013, 7:29 AM

## 2013-10-09 NOTE — Discharge Summary (Signed)
Patient ID: Leslie Sandoval MRN: [id number] DOB/AGE: [birth year] 53 y.o.  Admit date: 10/08/2013 Discharge date: 10/09/2013  Admission Diagnoses:  Principal Problem:   Impingement syndrome of left shoulder Active Problems:   Shoulder impingement   Discharge Diagnoses:  Same  Past Medical History  Diagnosis Date  . Hypertension   . GERD (gastroesophageal reflux disease)   . History of kidney stones april 2013  . Headache(784.0)     history of miagraines, none recent    Surgeries: Procedure(s): LEFT SHOULDER ARTHROSCOPY, MANIPULATION UNDER ANESTHESIA , DEBRIDEMENT LABRAL TEAR, SUBACROMIAL DECOMPRESSION  AND MINI OPEN DISTAL CLAVICLE RESECTION  on 10/08/2013   Consultants:    Discharged Condition: Improved  Hospital Course: Leslie Sandoval is an 53 y.o. female who was admitted 10/08/2013 for operative treatment ofImpingement syndrome of left shoulder, AC arthritis. Patient has severe unremitting pain that affects sleep, daily activities, and work/hobbies. After pre-op clearance the patient was taken to the operating room on 10/08/2013 and underwent  Procedure(s): LEFT SHOULDER ARTHROSCOPY, MANIPULATION UNDER ANESTHESIA , DEBRIDEMENT LABRAL TEAR, SUBACROMIAL DECOMPRESSION  AND MINI OPEN DISTAL CLAVICLE RESECTION .    Patient was given perioperative antibiotics: Anti-infectives   Start     Dose/Rate Route Frequency Ordered Stop   10/08/13 1700  ceFAZolin (ANCEF) IVPB 1 g/50 mL premix     1 g 100 mL/hr over 30 Minutes Intravenous Every 6 hours 10/08/13 1441 10/09/13 0612   10/08/13 1145  polymyxin B 500,000 Units, bacitracin 50,000 Units in sodium chloride irrigation 0.9 % 500 mL irrigation  Status:  Discontinued       As needed 10/08/13 1209 10/08/13 1218   10/08/13 0815  ceFAZolin (ANCEF) IVPB 2 g/50 mL premix     2 g 100 mL/hr over 30 Minutes Intravenous On call to O.R. 10/08/13 0814 10/08/13 1110       Patient was given sequential compression devices, early ambulation, and  chemoprophylaxis to prevent DVT.  Patient benefited maximally from hospital stay and there were no complications.    Recent vital signs: Patient Vitals for the past 24 hrs:  BP Temp Temp src Pulse Resp SpO2 Height Weight  10/09/13 0546 123/95 mmHg 98.4 F (36.9 C) Oral 55 18 96 % - -  10/09/13 0126 119/63 mmHg 97.8 F (36.6 C) Oral 71 16 95 % - -  10/08/13 1730 117/62 mmHg 98.7 F (37.1 C) - 67 16 97 % - -  10/08/13 1630 127/81 mmHg 98.2 F (36.8 C) - 91 16 95 % - -  10/08/13 1530 122/62 mmHg 97.9 F (36.6 C) - 93 18 98 % - -  10/08/13 1432 116/55 mmHg 98.4 F (36.9 C) - 73 16 97 % 5\' 4"  (1.626 m) 74.844 kg (165 lb)  10/08/13 1410 120/68 mmHg 98.1 F (36.7 C) - 74 13 95 % - -  10/08/13 1400 122/65 mmHg - - 73 13 96 % - -  10/08/13 1355 - - - 77 16 98 % - -  10/08/13 1345 120/66 mmHg 98.1 F (36.7 C) - 71 14 97 % - -  10/08/13 1330 129/67 mmHg - - 76 18 97 % - -  10/08/13 1319 - - - 79 17 100 % - -  10/08/13 1315 123/69 mmHg 98.1 F (36.7 C) - 77 13 99 % - -  10/08/13 1313 123/69 mmHg - - 78 12 100 % - -  10/08/13 1302 152/67 mmHg - - 63 14 100 % - -  10/08/13 1300 152/67 mmHg - -  72 16 100 % - -  10/08/13 1253 - - - 73 13 100 % - -  10/08/13 1250 - - - 67 13 100 % - -  10/08/13 1245 162/65 mmHg - - 67 14 100 % - -  10/08/13 1240 - - - 71 15 100 % - -  10/08/13 1230 160/71 mmHg - - 72 16 100 % - -  10/08/13 1221 145/70 mmHg 98 F (36.7 C) - 81 16 100 % - -  10/08/13 0812 137/92 mmHg 98.3 F (36.8 C) Oral 67 18 100 % - -     Recent laboratory studies: No results found for this basename: WBC, HGB, HCT, PLT, NA, K, CL, CO2, BUN, CREATININE, GLUCOSE, PT, INR, CALCIUM, 2,  in the last 72 hours   Discharge Medications:     Medication List    STOP taking these medications       oxyCODONE-acetaminophen 5-325 MG per tablet  Commonly known as:  PERCOCET/ROXICET  Replaced by:  oxyCODONE-acetaminophen 7.5-325 MG per tablet      TAKE these medications       aspirin EC 81  MG tablet  Take 81 mg by mouth daily.     Dexlansoprazole 30 MG capsule  Take 30 mg by mouth every evening.     docusate sodium 100 MG capsule  Commonly known as:  COLACE  Take 1 capsule (100 mg total) by mouth 2 (two) times daily as needed for mild constipation.     ibuprofen 200 MG tablet  Commonly known as:  ADVIL,MOTRIN  Take 400 mg by mouth every 6 (six) hours as needed for mild pain or moderate pain.     methocarbamol 500 MG tablet  Commonly known as:  ROBAXIN  Take 1 tablet (500 mg total) by mouth every 8 (eight) hours as needed for muscle spasms.     olmesartan 20 MG tablet  Commonly known as:  BENICAR  Take 20 mg by mouth at bedtime.     oxyCODONE-acetaminophen 7.5-325 MG per tablet  Commonly known as:  PERCOCET  Take 1 tablet by mouth every 4 (four) hours as needed for pain.        Diagnostic Studies: Dg Chest 2 View  10/01/2013   CLINICAL DATA:  Hypertension, preop for left shoulder arthroscopy  EXAM: CHEST  2 VIEW  COMPARISON:  None.  FINDINGS: Cardiomediastinal silhouette is unremarkable. No acute infiltrate or pleural effusion. No pulmonary edema. Degenerative changes thoracic spine.  IMPRESSION: No active cardiopulmonary disease.   Electronically Signed   By: Lahoma Crocker M.D.   On: 10/01/2013 09:24    Disposition: home       Discharge Instructions   Call MD / Call 911    Complete by:  As directed   If you experience chest pain or shortness of breath, CALL 911 and be transported to the hospital emergency room.  If you develope a fever above 101 F, pus (white drainage) or increased drainage or redness at the wound, or calf pain, call your surgeon's office.     Constipation Prevention    Complete by:  As directed   Drink plenty of fluids.  Prune juice may be helpful.  You may use a stool softener, such as Colace (over the counter) 100 mg twice a day.  Use MiraLax (over the counter) for constipation as needed.     Diet - low sodium heart healthy    Complete by:   As directed      Increase  activity slowly as tolerated    Complete by:  As directed            Follow-up Information   Follow up with BEANE,JEFFREY C, MD In 2 weeks. (For suture removal)    Specialty:  Orthopedic Surgery   Contact information:   [address] [place] [address] [phone]        Signed: Cecilie Kicks. 10/09/2013, 7:33 AM

## 2013-10-09 NOTE — Progress Notes (Signed)
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 Provided handout.  Patient is ready for DC.   Claretha Cooper 10/09/2013, 9:42 AM Tresa Endo PT [phone]

## 2015-10-01 IMAGING — CR DG CHEST 2V
2 series · 2 of 2 positions shown · non-contrast
Comparison: None.

CLINICAL DATA: Hypertension, preop for left shoulder arthroscopy

EXAM:
CHEST  2 VIEW

[w chest pa]
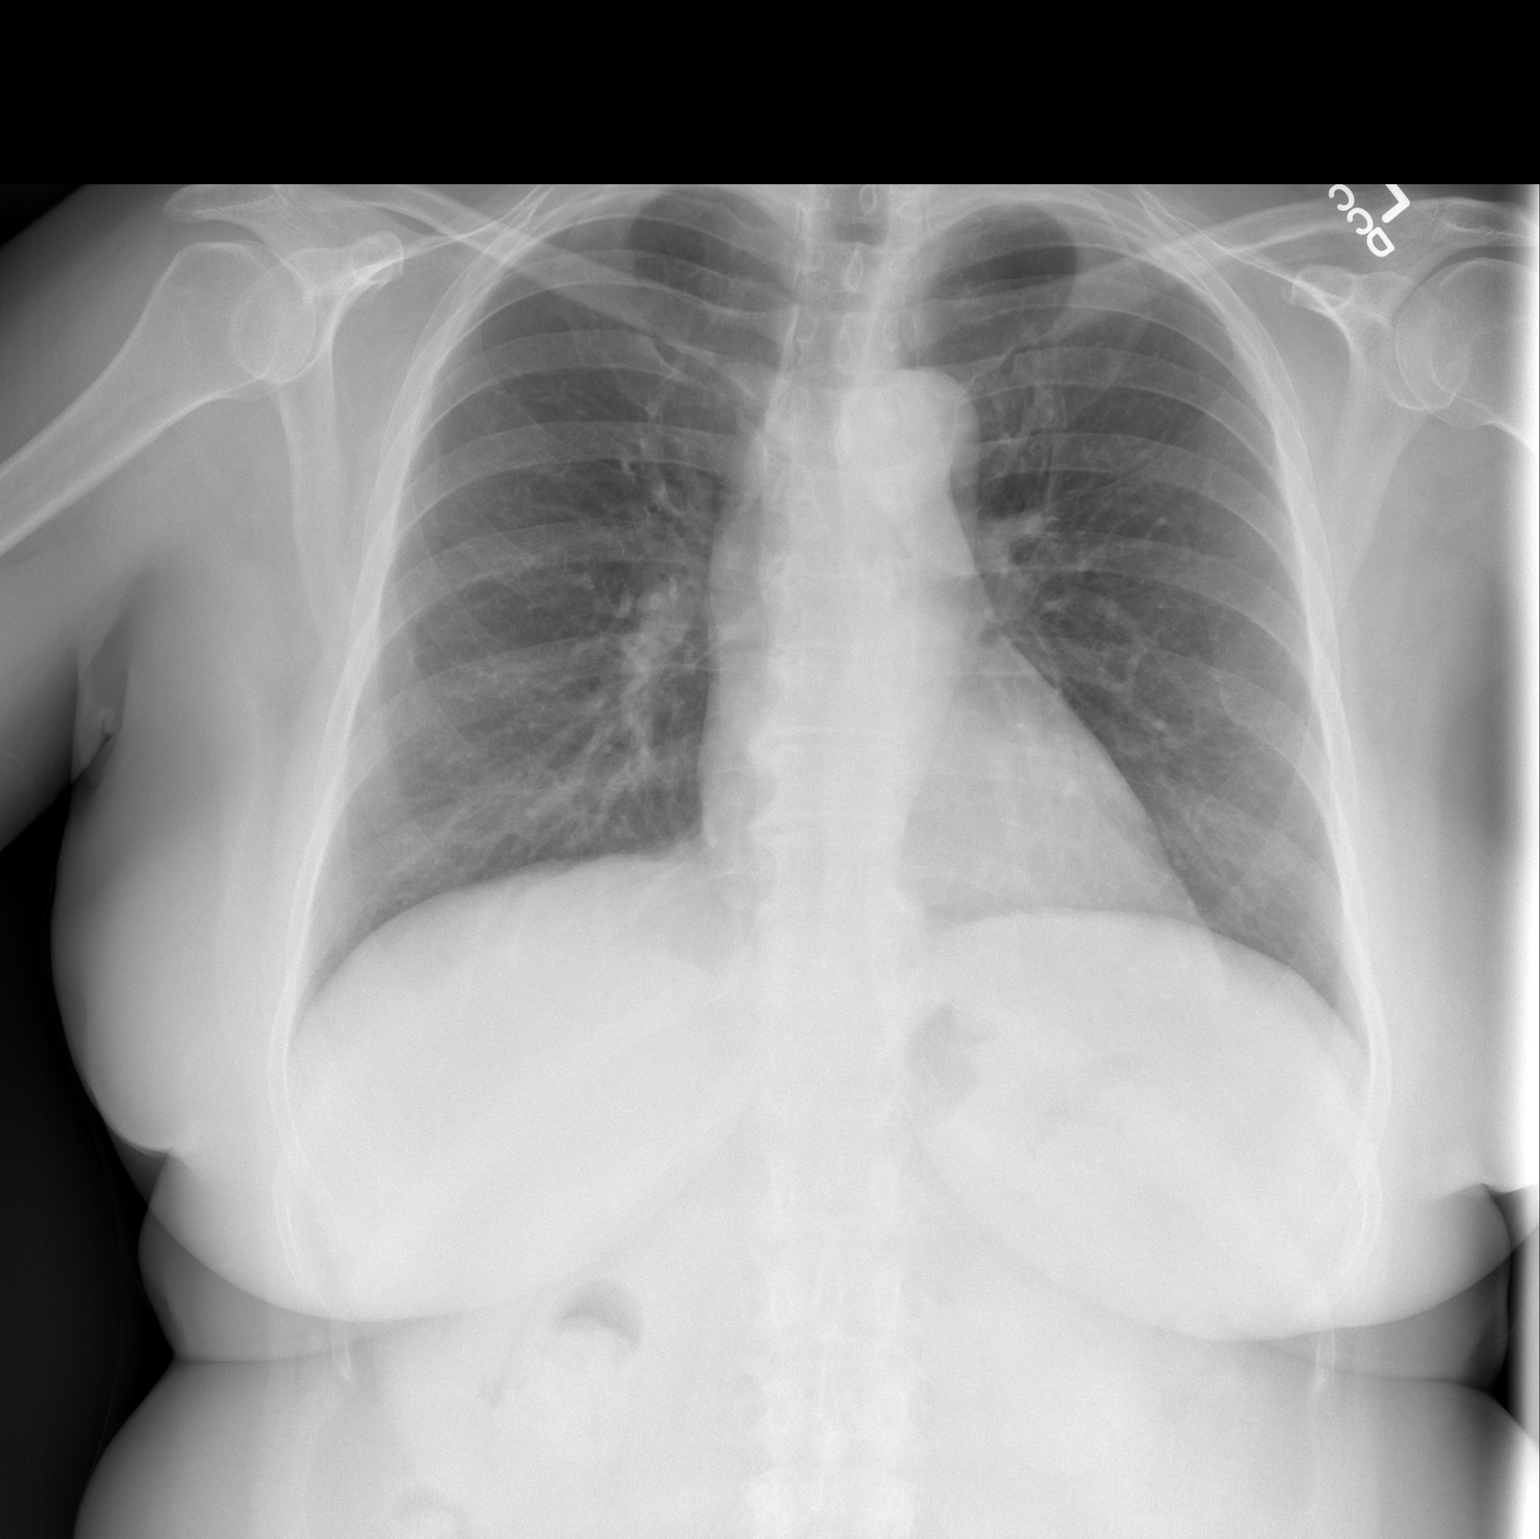

[w chest lat]
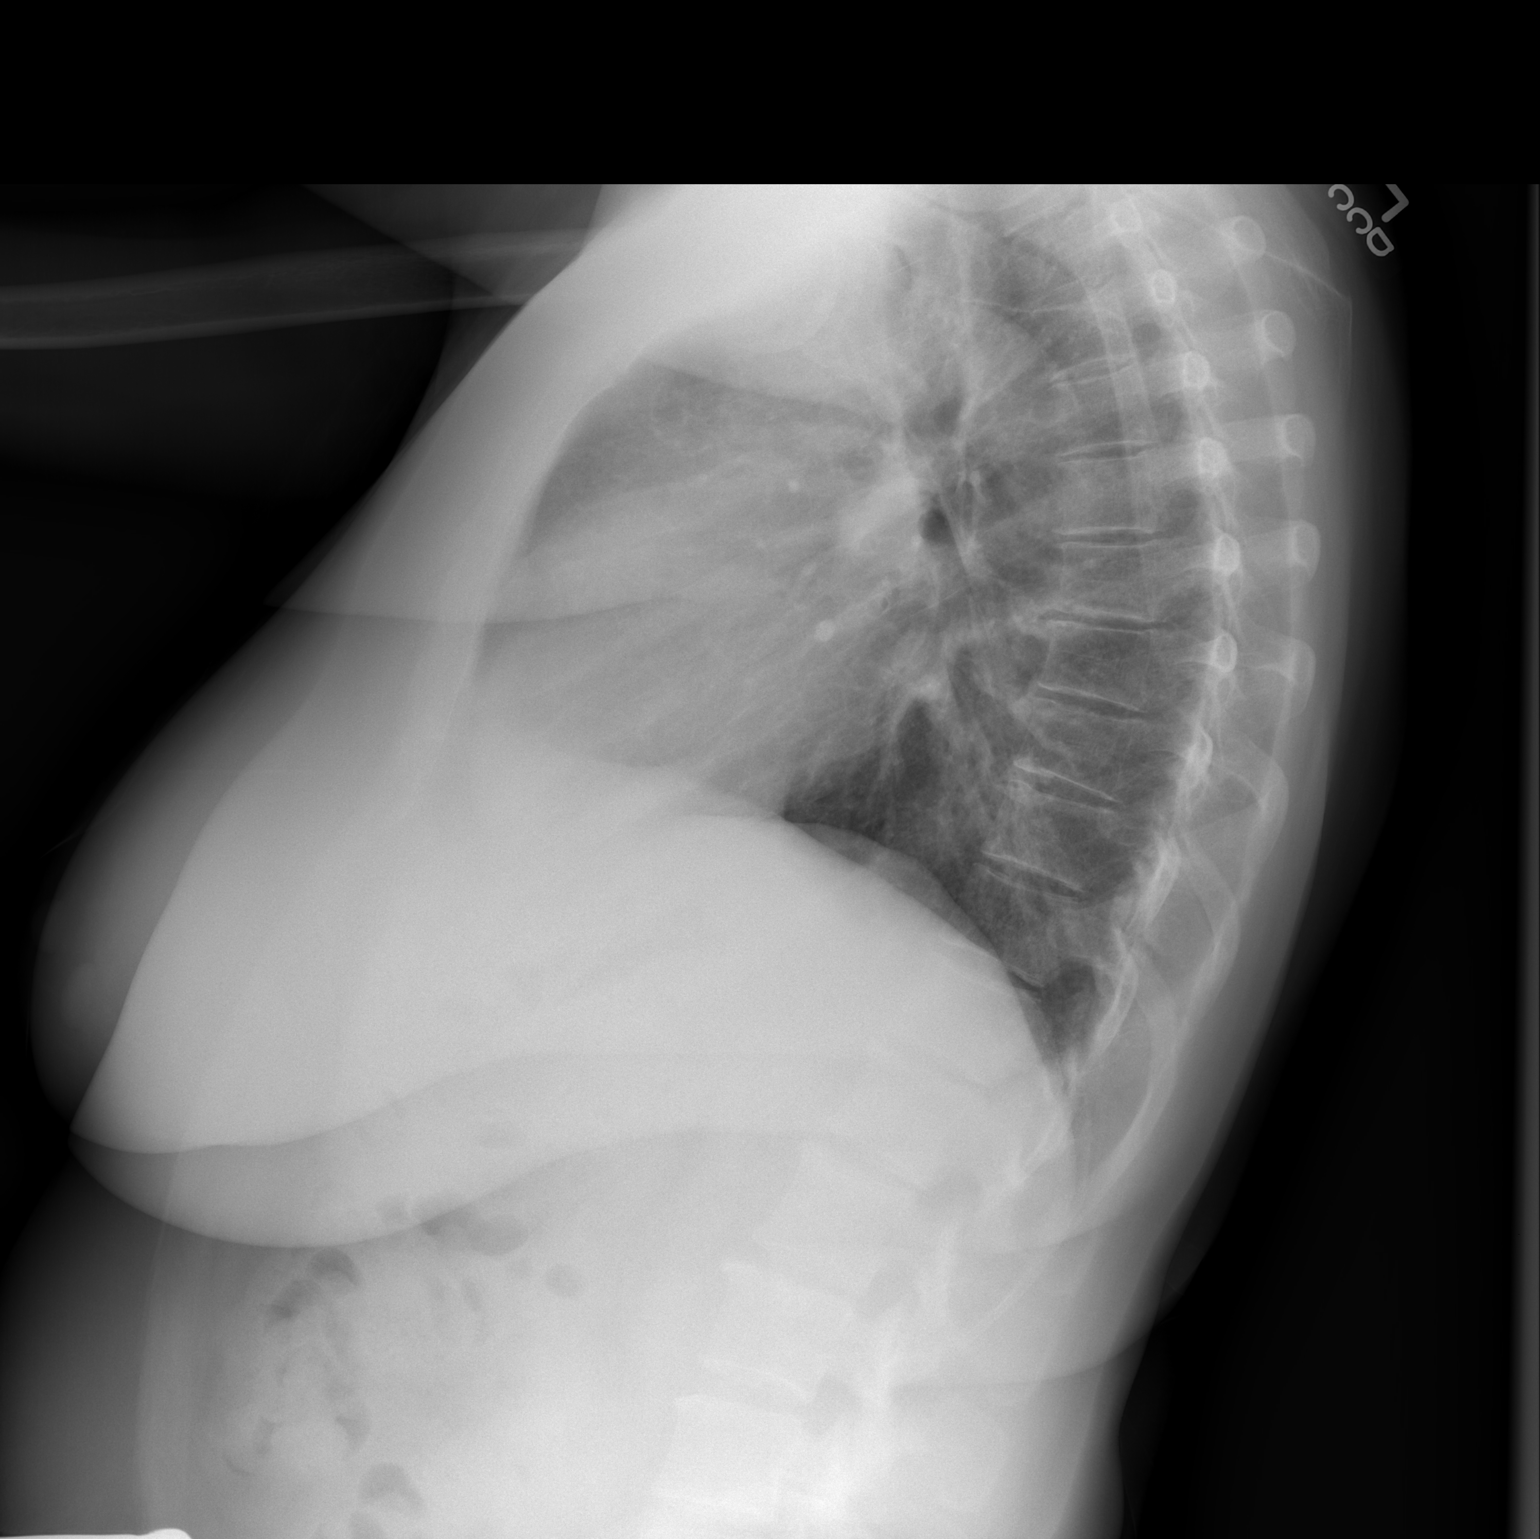

[2 of 2 positions shown; findings below may reference images not displayed]

FINDINGS: Cardiomediastinal silhouette is unremarkable. No acute infiltrate or
pleural effusion. No pulmonary edema. Degenerative changes thoracic
spine.
IMPRESSION: No active cardiopulmonary disease.

## 2017-08-20 ENCOUNTER — Encounter: Payer: Self-pay | Admitting: Gastroenterology

## 2017-10-08 ENCOUNTER — Encounter: Payer: Self-pay | Admitting: Gastroenterology

## 2017-10-08 ENCOUNTER — Ambulatory Visit (AMBULATORY_SURGERY_CENTER): Payer: Self-pay

## 2017-10-08 VITALS — Ht 64.0 in | Wt 181.6 lb

## 2017-10-08 DIAGNOSIS — Z1211 Encounter for screening for malignant neoplasm of colon: Secondary | ICD-10-CM

## 2017-10-08 MED ORDER — NA SULFATE-K SULFATE-MG SULF 17.5-3.13-1.6 GM/177ML PO SOLN: 1.0000 | Freq: Once | ORAL | 0 refills | Status: AC

## 2017-10-08 NOTE — Progress Notes (Signed)
No egg or soy allergy known to patient  No issues with past sedation with any surgeries  or procedures, no intubation problems  No diet pills per patient No home 02 use per patient  No blood thinners per patient  Pt denies issues with constipation  No A fib or A flutter  EMMI video sent to pt's e mail  Pt. declined 

## 2017-10-21 ENCOUNTER — Encounter: Payer: Self-pay | Admitting: Gastroenterology

## 2017-10-21 ENCOUNTER — Ambulatory Visit (AMBULATORY_SURGERY_CENTER): Payer: Managed Care, Other (non HMO) | Admitting: Gastroenterology

## 2017-10-21 VITALS — BP 124/58 | HR 52 | Temp 99.3°F | Resp 16 | Ht 64.0 in | Wt 181.0 lb

## 2017-10-21 DIAGNOSIS — D123 Benign neoplasm of transverse colon: Secondary | ICD-10-CM | POA: Diagnosis not present

## 2017-10-21 DIAGNOSIS — Z1211 Encounter for screening for malignant neoplasm of colon: Secondary | ICD-10-CM

## 2017-10-21 MED ORDER — SODIUM CHLORIDE 0.9 % IV SOLN: 500.0000 mL | Freq: Once | INTRAVENOUS | Status: DC

## 2017-10-21 NOTE — Progress Notes (Signed)
Called to room to assist during endoscopic procedure.  Patient ID and intended procedure confirmed with present staff. Received instructions for my participation in the procedure from the performing physician.  

## 2017-10-21 NOTE — Progress Notes (Signed)
Pt's states no medical or surgical changes since previsit or office visit. 

## 2017-10-21 NOTE — Op Note (Signed)
[place] Patient Name: Leslie Sandoval Procedure Date: 10/21/2017 11:17 AM MRN: [id number] Endoscopist: Jackquline Denmark , MD Age: 57 Referring MD:  Date of Birth: Apr 21, 1960 Gender: Female Account #: [id number] Procedure:                Colonoscopy Indications:              Screening for colorectal malignant neoplasm Medicines:                Monitored Anesthesia Care Procedure:                Pre-Anesthesia Assessment:                           - Prior to the procedure, a History and Physical                            was performed, and patient medications and                            allergies were reviewed. The patient's tolerance of                            previous anesthesia was also reviewed. The risks                            and benefits of the procedure and the sedation                            options and risks were discussed with the patient.                            All questions were answered, and informed consent                            was obtained. Prior Anticoagulants: The patient has                            taken no previous anticoagulant or antiplatelet                            agents. ASA Grade Assessment: II - A patient with                            mild systemic disease. After reviewing the risks                            and benefits, the patient was deemed in                            satisfactory condition to undergo the procedure.                           After obtaining informed consent, the colonoscope  was passed under direct vision. Throughout the                            procedure, the patient's blood pressure, pulse, and                            oxygen saturations were monitored continuously. The                            Colonoscope was introduced through the anus and                            advanced to the 2 cm into the ileum. The                            colonoscopy was performed  without difficulty. The                            patient tolerated the procedure well. The quality                            of the bowel preparation was excellent. Scope In: 11:24:41 AM Scope Out: 11:34:53 AM Scope Withdrawal Time: 0 hours 6 minutes 25 seconds  Total Procedure Duration: 0 hours 10 minutes 12 seconds  Findings:                 A 6 mm polyp was found in the proximal transverse                            colon. The polyp was sessile. The polyp was removed                            with a cold snare. Resection and retrieval were                            complete. Estimated blood loss: none.                           Minimal small non-bleeding internal hemorrhoids                            were found.                           The exam was otherwise without abnormality on                            direct and retroflexion views. Complications:            No immediate complications. Estimated Blood Loss:     Estimated blood loss: none. Impression:               - Colonic polyp status post polypectomy.                           -  Otherwise normal colonoscopy to terminal ileum. Recommendation:           - Patient has a contact number available for                            emergencies. The signs and symptoms of potential                            delayed complications were discussed with the                            patient. Return to normal activities tomorrow.                            Written discharge instructions were provided to the                            patient.                           - Resume previous diet.                           - Continue present medications.                           - Await pathology results.                           - Repeat colonoscopy for surveillance based on                            pathology results. Jackquline Denmark, MD 10/21/2017 11:38:58 AM This report has been signed electronically.

## 2017-10-21 NOTE — Patient Instructions (Signed)
  Thank you for allowing [place] to care for you today!  Await pathology results by mail, approx 2 weeks.  Next colonoscopy to be determined after pathology results.  Resume previous diet and medications.  Return to normal activities tomorrow.     YOU HAD AN ENDOSCOPIC PROCEDURE TODAY AT [facility]:   Refer to the procedure report that was given to you for any specific questions about what was found during the examination.  If the procedure report does not answer your questions, please call your gastroenterologist to clarify.  If you requested that your care partner not be given the details of your procedure findings, then the procedure report has been included in a sealed envelope for you to review at your convenience later.  YOU SHOULD EXPECT: Some feelings of bloating in the abdomen. Passage of more gas than usual.  Walking can help get rid of the air that was put into your GI tract during the procedure and reduce the bloating. If you had a lower endoscopy (such as a colonoscopy or flexible sigmoidoscopy) you may notice spotting of blood in your stool or on the toilet paper. If you underwent a bowel prep for your procedure, you may not have a normal bowel movement for a few days.  Please Note:  You might notice some irritation and congestion in your nose or some drainage.  This is from the oxygen used during your procedure.  There is no need for concern and it should clear up in a day or so.  SYMPTOMS TO REPORT IMMEDIATELY:   Following lower endoscopy (colonoscopy or flexible sigmoidoscopy):  Excessive amounts of blood in the stool  Significant tenderness or worsening of abdominal pains  Swelling of the abdomen that is new, acute  Fever of 100F or higher    For urgent or emergent issues, a gastroenterologist can be reached at any hour by calling [phone].   DIET:  We do recommend a small meal at first, but then you may proceed to your regular diet.  Drink  plenty of fluids but you should avoid alcoholic beverages for 24 hours.  ACTIVITY:  You should plan to take it easy for the rest of today and you should NOT DRIVE or use heavy machinery until tomorrow (because of the sedation medicines used during the test).    FOLLOW UP: Our staff will call the number listed on your records the next business day following your procedure to check on you and address any questions or concerns that you may have regarding the information given to you following your procedure. If we do not reach you, we will leave a message.  However, if you are feeling well and you are not experiencing any problems, there is no need to return our call.  We will assume that you have returned to your regular daily activities without incident.  If any biopsies were taken you will be contacted by phone or by letter within the next 1-3 weeks.  Please call us at [phone] if you have not heard about the biopsies in 3 weeks.    SIGNATURES/CONFIDENTIALITY: You and/or your care partner have signed paperwork which will be entered into your electronic medical record.  These signatures attest to the fact that that the information above on your After Visit Summary has been reviewed and is understood.  Full responsibility of the confidentiality of this discharge information lies with you and/or your care-partner.

## 2017-10-22 ENCOUNTER — Telehealth: Payer: Self-pay

## 2017-10-22 NOTE — Telephone Encounter (Signed)
Called [phone] and left a messaged we tried to reach pt for a follow up call. maw

## 2017-10-22 NOTE — Telephone Encounter (Signed)
  Follow up Call-  Call back number 10/21/2017  Post procedure Call Back phone  # [phone]  Permission to leave phone message Yes  Some recent data might be hidden     Patient questions:  Do you have a fever, pain , or abdominal swelling? No  Pain Score  0 *  Have you tolerated food without any problems? Yes.    Have you been able to return to your normal activities? Yes.    Do you have any questions about your discharge instructions: Diet   No. Medications  No. Follow up visit  No.  Do you have questions or concerns about your Care? No.  Actions: * If pain score is 4 or above: No action needed, pain <4.

## 2017-10-24 ENCOUNTER — Encounter: Payer: Self-pay | Admitting: Gastroenterology

## 2020-09-27 ENCOUNTER — Other Ambulatory Visit: Payer: Self-pay | Admitting: Obstetrics and Gynecology

## 2020-09-27 DIAGNOSIS — Z1231 Encounter for screening mammogram for malignant neoplasm of breast: Secondary | ICD-10-CM

## 2022-11-14 ENCOUNTER — Encounter: Payer: Self-pay | Admitting: Gastroenterology

## 2023-02-13 ENCOUNTER — Encounter: Payer: Self-pay | Admitting: Gastroenterology

## 2023-04-16 ENCOUNTER — Encounter: Payer: Managed Care, Other (non HMO) | Admitting: Gastroenterology

## 2023-09-16 ENCOUNTER — Other Ambulatory Visit (HOSPITAL_BASED_OUTPATIENT_CLINIC_OR_DEPARTMENT_OTHER): Payer: Self-pay | Admitting: Nurse Practitioner

## 2023-09-16 DIAGNOSIS — E782 Mixed hyperlipidemia: Secondary | ICD-10-CM

## 2023-09-24 ENCOUNTER — Ambulatory Visit (INDEPENDENT_AMBULATORY_CARE_PROVIDER_SITE_OTHER)
Admission: RE | Admit: 2023-09-24 | Discharge: 2023-09-24 | Disposition: A | Discharge status: Home / Self Care | Payer: Self-pay | Source: Ambulatory Visit | Attending: Nurse Practitioner | Admitting: Nurse Practitioner

## 2023-09-24 DIAGNOSIS — E782 Mixed hyperlipidemia: Secondary | ICD-10-CM
